# Patient Record
Sex: Female | Born: 1955 | Race: Black or African American | Hispanic: No | Marital: Single | Smoking: Never smoker
Health system: Southern US, Community
[De-identification: ages and names within clinical notes are randomized; demographics above are authoritative.]

## PROBLEM LIST (undated history)

## (undated) DIAGNOSIS — K219 Gastro-esophageal reflux disease without esophagitis: Secondary | ICD-10-CM

## (undated) DIAGNOSIS — Z789 Other specified health status: Secondary | ICD-10-CM

## (undated) DIAGNOSIS — I10 Essential (primary) hypertension: Secondary | ICD-10-CM

## (undated) HISTORY — DX: Gastro-esophageal reflux disease without esophagitis: K21.9

## (undated) HISTORY — PX: NO PAST SURGERIES: SHX2092

## (undated) HISTORY — DX: Essential (primary) hypertension: I10

---

## 1998-09-23 ENCOUNTER — Other Ambulatory Visit: Admission: RE | Admit: 1998-09-23 | Discharge: 1998-09-23 | Payer: Self-pay | Admitting: Nephrology

## 1999-02-14 ENCOUNTER — Emergency Department (HOSPITAL_COMMUNITY): Admission: EM | Admit: 1999-02-14 | Discharge: 1999-02-14 | Payer: Self-pay | Admitting: Emergency Medicine

## 1999-02-14 ENCOUNTER — Encounter: Payer: Self-pay | Admitting: Emergency Medicine

## 2001-05-09 ENCOUNTER — Inpatient Hospital Stay (HOSPITAL_COMMUNITY): Admission: AD | Admit: 2001-05-09 | Discharge: 2001-05-09 | Payer: Self-pay | Admitting: Obstetrics & Gynecology

## 2002-09-28 ENCOUNTER — Emergency Department (HOSPITAL_COMMUNITY): Admission: EM | Admit: 2002-09-28 | Discharge: 2002-09-28 | Payer: Self-pay | Admitting: Emergency Medicine

## 2005-01-15 ENCOUNTER — Emergency Department (HOSPITAL_COMMUNITY): Admission: EM | Admit: 2005-01-15 | Discharge: 2005-01-15 | Payer: Self-pay | Admitting: Emergency Medicine

## 2005-01-18 ENCOUNTER — Emergency Department (HOSPITAL_COMMUNITY): Admission: EM | Admit: 2005-01-18 | Discharge: 2005-01-19 | Payer: Self-pay | Admitting: Emergency Medicine

## 2006-01-19 IMAGING — CR DG CHEST 2V
2 series · 2 of 2 positions shown · non-contrast
Comparison: none

CLINICAL DATA: 49-year-old with headache and fever. 
 CHEST - TWO VIEW:
 Two views of the chest without prior studies for comparison demonstrate the cardiac silhouette, mediastinal and hilar contours to be within normal limits.  There is streaky bibasilar atelectasis and mild elevation of the right hemidiaphragm.  No edema or effusions.

[view not recorded (1 of 2)]
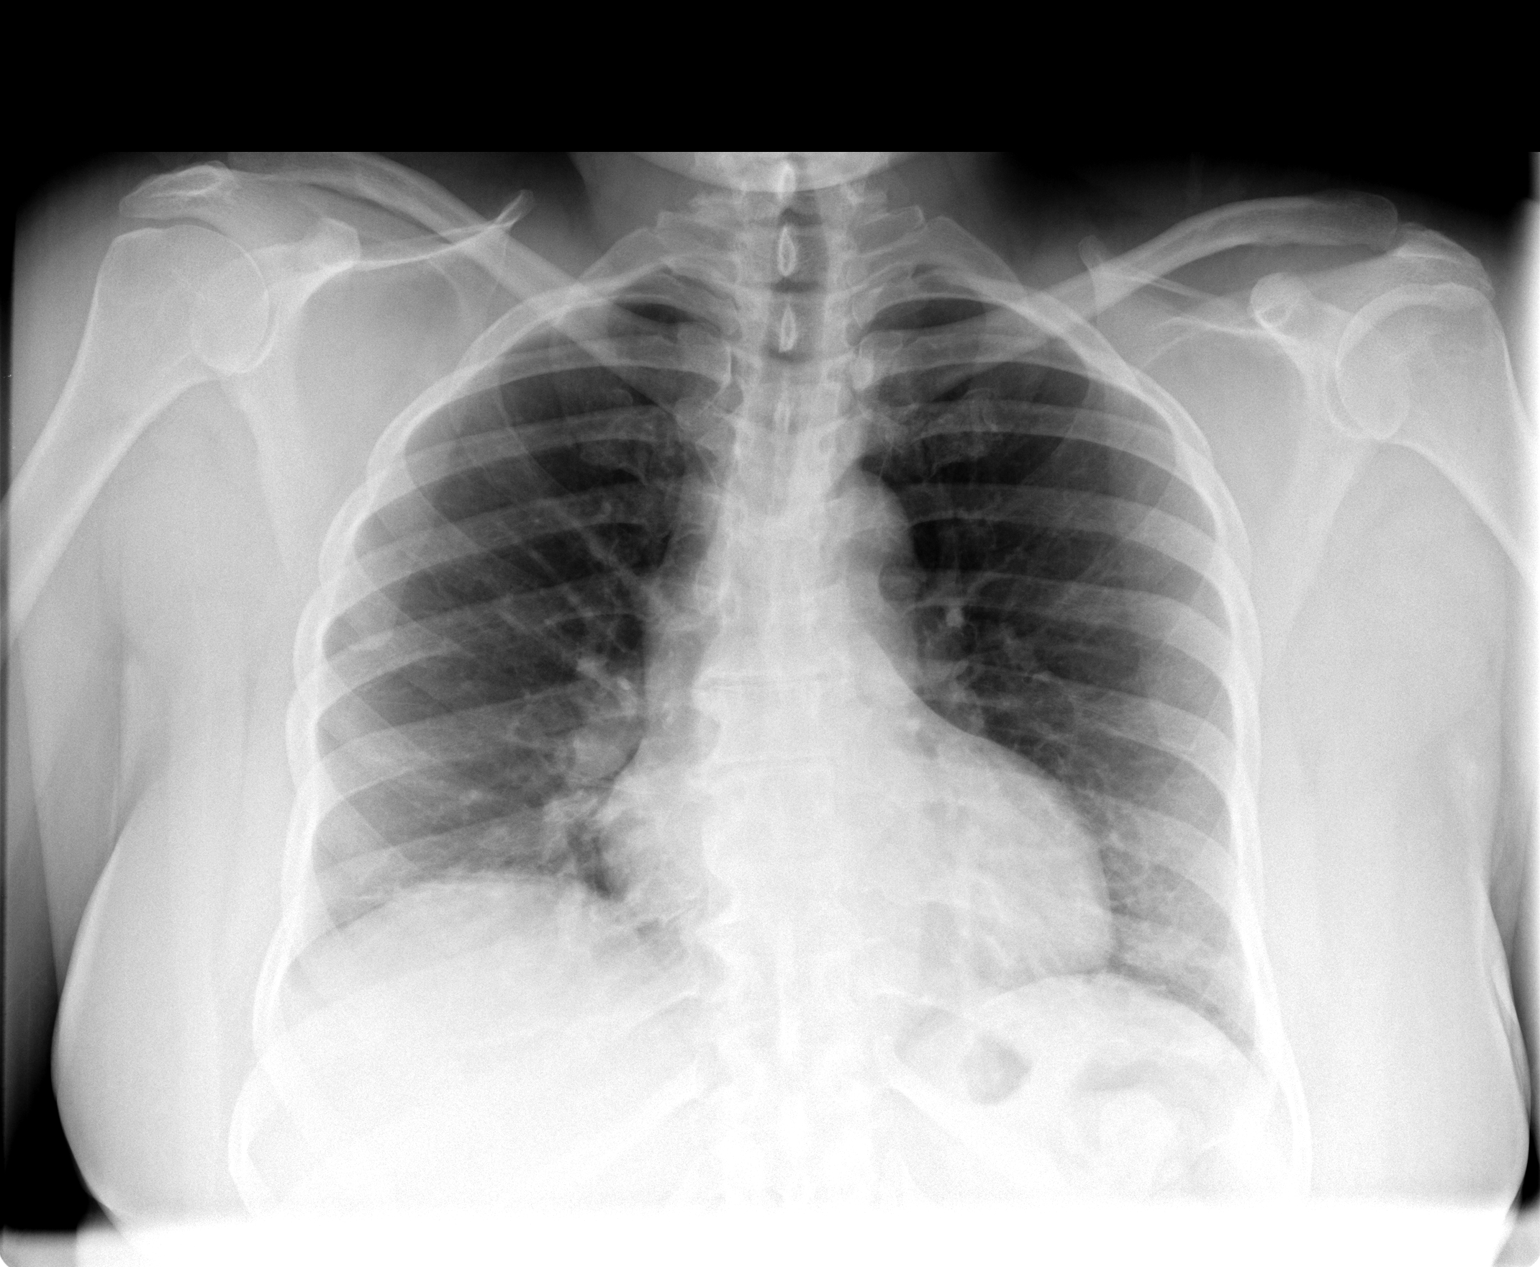

[view not recorded (2 of 2)]
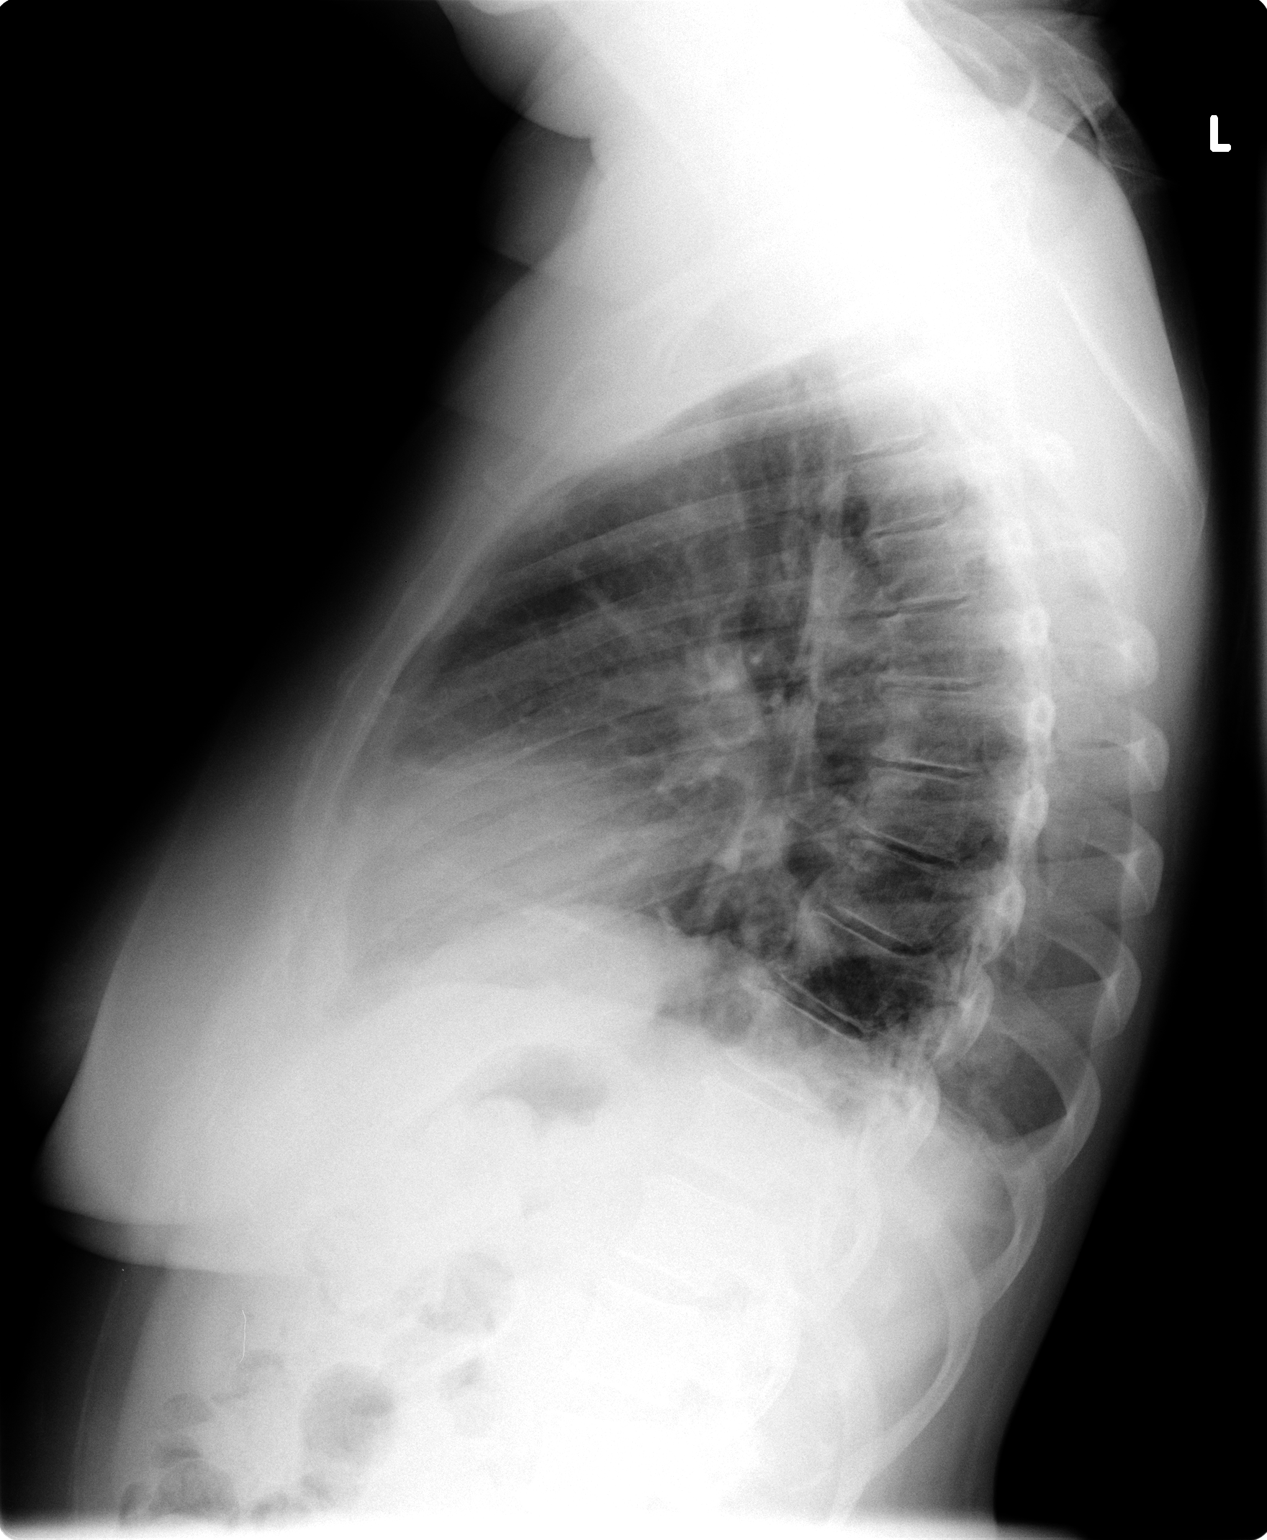

[2 of 2 positions shown; findings below may reference images not displayed]

IMPRESSION: Streaky bibasilar atelectasis.  No focal infiltrates, or edema.

## 2008-06-10 ENCOUNTER — Inpatient Hospital Stay (HOSPITAL_COMMUNITY): Admission: AD | Admit: 2008-06-10 | Discharge: 2008-06-10 | Payer: Self-pay | Admitting: Gynecology

## 2011-04-24 NOTE — Consult Note (Signed)
Robin Robin [name], Robin [name] NO.:  [id number]   MEDICAL RECORD NO.:  [id number]          PATIENT TYPE:  EMS   LOCATION:  ED                           FACILITY:  [facility]   PHYSICIAN:  Candyce Churn, M.D.DATE OF BIRTH:  04-14-1956   DATE OF CONSULTATION:  DATE OF DISCHARGE:                                   CONSULTATION   FINDINGS:  1.  Headache, resolving.  2.  Possible drug reaction to cephalexin causing her headache.  3.  History of allergy to SULFA and PENICILLIN--she has gotten a rash to      sulfa in the past and she had hives to a form of penicillin when she was      55 years old.  4.  Moderate obesity.   DISCHARGE MEDICATIONS:  Toradol 10 mg p.o. t.i.d. for 3-4 days p.r.n.  headache. This will be called in to [facility] on [address]10 doses  with no refills.   ER COURSE:  Robin Robin [name] is a very pleasant 55 year old female with no  significant past medical history except for allergies to sulfa and  penicillin.  She was seen three days prior to this evaluation in the  emergency room with a swollen gland under her left axilla.  She was given  a prescription for Keflex 500 mg 4 times a day.   She took several doses of the Keflex on Friday and Saturday and started to  develop a severe headache for which she presented to the [facility]  Emergency Room on January 19, 2005 at 025 in the a.m.   She had a severe headache on admission. She did not have photophobia. She  did have some nausea.  She was evaluated by the emergency room staff and  lumbar puncture and head CT were performed. Both of these examinations were  benign and after treatment with narcotics she was given 60 mg of Toradol IV  and her headache has essentially resolved. She is feeling back to her  baseline and would like to be discharged home.   PHYSICAL EXAMINATION:  GENERAL:  She is alert and oriented and comfortable.  VITAL SIGNS:  On admission revealed a temperature of 101.1, respiratory  rate  was 20, heart rate 108, blood pressure 149/79.  O2 saturation on room air  was 98%.  By 1478 a.m., her temperature was 98.5, heart rate 86, blood  pressure 120/82 and respiratory rate 18.  HEENT:  Reveals pupils are equal and reactive, extraocular movements intact.  Oropharynx is clear.  NECK:  Supple without JVD or thyromegaly or thyroid tenderness or masses.  Cranial nerves II-XII are intact.  CHEST:  Clear to auscultation.  CARDIAC:  Reveals a regular rhythm without murmurs, rubs or gallops.  ABDOMEN:  Soft, nontender, nondistended, no hepatomegaly or splenomegaly  noted. Bowel sounds are normal.  PELVIC/RECTAL:  Not performed.  BREASTS:  Not performed. I did examine under her left axilla and there was  no adenopathy, erythema or swelling in that area.  EXTREMITIES:  Without cyanosis, clubbing or edema. They are warm and  nonmodeled. Good capillary refill distally. The  skin is without rashes.   LABORATORY DATA:  Revealed a white count of 7400, hemoglobin 10.4, platelet  count 345,000. She has 82% neutrophils on differential. Cerebral spinal  fluid is colorless and clear. She had two red cells and one white call on  cell count.  Glucose CSF was 63, total protein was only 22.   CMET revealed a sodium of 135, potassium 3.9, chloride 103, bicarb 26,  glucose 127, BUN 8, creatinine 0.9, calcium 8.5. Total protein 7, albumin  2.7, SGOT 25, SGPT 25, alkaline phosphatase 82, total bilirubin 0.5,  urinalysis was clear and yellow. Specific gravity of 1.003, urine pH 6.5,  glucose negative, hemoglobin was large, bilirubin negative, ketones  negative, total protein 100. Nitrite negative, leukocyte esterase trace. 0-2  white cells, 0-2 red cells, bacteria few.   Chest x-ray revealed streaky bibasilar atelectasis, no focal infiltrates or  edema.  Noncontrast head CT revealed no acute intracranial abnormality.  Again, my evaluation at [facility] on January 19, 2005, she had markedly improved   symptomatically and was feeling quite well.  She had been intravenously  hydrated overnight and there does not appear to be any evidence of any  systemic bacterial infection and she certainly could have a mild viral  infection which is symptomatically and clinically much improved at this  time. I will call in Toradol as mentioned above, 10 mg p.o. t.i.d. to take  with food up to 72 hours or up to 10 doses and she will return for followup  should she not continue to improve. 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   She does have low grade proteinuria and she should make an appointment to  followup with a primary care physician in the next week or so.      RNG/MEDQ  D:  01/19/2005  T:  01/19/2005  Job:  [id number]

## 2011-09-03 LAB — POCT PREGNANCY, URINE
Operator id: 117411
Preg Test, Ur: NEGATIVE

## 2012-12-19 ENCOUNTER — Emergency Department (HOSPITAL_COMMUNITY)
Admission: EM | Admit: 2012-12-19 | Discharge: 2012-12-19 | Disposition: A | Discharge status: Home / Self Care | Payer: Self-pay | Attending: Emergency Medicine | Admitting: Emergency Medicine

## 2012-12-19 DIAGNOSIS — T4995XA Adverse effect of unspecified topical agent, initial encounter: Secondary | ICD-10-CM | POA: Insufficient documentation

## 2012-12-19 DIAGNOSIS — T783XXA Angioneurotic edema, initial encounter: Secondary | ICD-10-CM | POA: Insufficient documentation

## 2012-12-19 LAB — BASIC METABOLIC PANEL
BUN: 12 mg/dL (ref 6–23)
Chloride: 103 mEq/L (ref 96–112)
Creatinine, Ser: 0.76 mg/dL (ref 0.50–1.10)
GFR calc Af Amer: >90 mL/min (ref >90)
Glucose, Bld: 106 mg/dL — ABNORMAL HIGH (ref 70–99)
Potassium: 3.6 mEq/L (ref 3.5–5.1)

## 2012-12-19 LAB — CBC
HCT: 34.1 % — ABNORMAL LOW (ref 36.0–46.0)
Hemoglobin: 11.3 g/dL — ABNORMAL LOW (ref 12.0–15.0)
MCV: 84.8 fL (ref 78.0–100.0)
RDW: 13.4 % (ref 11.5–15.5)
WBC: 3.8 10*3/uL — ABNORMAL LOW (ref 4.0–10.5)

## 2012-12-19 MED ORDER — PREDNISONE 20 MG PO TABS: 60.0000 mg | ORAL_TABLET | Freq: Every day | ORAL | Status: DC

## 2012-12-19 MED ORDER — FAMOTIDINE 20 MG PO TABS: 20.0000 mg | ORAL_TABLET | Freq: Two times a day (BID) | ORAL | Status: DC

## 2012-12-19 MED ORDER — DIPHENHYDRAMINE HCL 25 MG PO TABS: 25.0000 mg | ORAL_TABLET | Freq: Four times a day (QID) | ORAL | Status: DC

## 2012-12-19 MED ORDER — METHYLPREDNISOLONE SODIUM SUCC 125 MG IJ SOLR
125.0000 mg | Freq: Once | INTRAMUSCULAR | Status: AC
  Administered 2012-12-19: 125 mg via INTRAVENOUS
  Filled 2012-12-19: qty 2

## 2012-12-19 MED ORDER — DIPHENHYDRAMINE HCL 50 MG/ML IJ SOLN
25.0000 mg | Freq: Once | INTRAMUSCULAR | Status: AC
  Administered 2012-12-19: 25 mg via INTRAVENOUS
  Filled 2012-12-19: qty 1

## 2012-12-19 MED ORDER — FAMOTIDINE IN NACL 20-0.9 MG/50ML-% IV SOLN
20.0000 mg | Freq: Once | INTRAVENOUS | Status: AC
  Administered 2012-12-19: 20 mg via INTRAVENOUS
  Filled 2012-12-19: qty 50

## 2012-12-19 NOTE — ED Notes (Signed)
Discharge inst given  Voiced understanding 

## 2012-12-19 NOTE — ED Notes (Signed)
Pt arrived via private vehicle from home. Pt states she woke and feels like something is digging in her throat. Pt states difficulty breathing, and unable to swallow saliva

## 2012-12-19 NOTE — ED Provider Notes (Signed)
History     CSN: [id number]  Arrival date & time 12/19/12  6578   First MD Initiated Contact with Patient 12/19/12 0324      Chief Complaint  Patient presents with  . Airway Obstruction    (Consider location/radiation/quality/duration/timing/severity/associated sxs/prior treatment) HPI History provided by patient. Went to bed in her normal state of health and woke up with difficulty swallowing and breathing due to something swelling in the back of her throat. She denies any sore throat. No itching. No rash. No history of same. No medications. No new foods or known new exposures. No history of same. No family history of angioedema.   No past medical history on file.  No past surgical history on file.  No family history on file.  History  Substance Use Topics  . Smoking status: Not on file  . Smokeless tobacco: Not on file  . Alcohol Use: Not on file    OB History    No data available      Review of Systems  Constitutional: Negative for fever and chills.  HENT: Positive for trouble swallowing. Negative for sore throat, neck pain, neck stiffness and voice change.   Eyes: Negative for pain.  Respiratory: Negative for cough and wheezing.   Cardiovascular: Negative for chest pain.  Gastrointestinal: Negative for abdominal pain.  Genitourinary: Negative for dysuria.  Musculoskeletal: Negative for back pain.  Skin: Negative for rash.  Neurological: Negative for headaches.  All other systems reviewed and are negative.    Allergies  Sulfa antibiotics  Home Medications  No current outpatient prescriptions on file.  BP 160/94  Pulse 109  Temp 99 F (37.2 C) (Oral)  Physical Exam  Constitutional: She is oriented to person, place, and time. She appears well-developed and well-nourished.  HENT:  Head: Normocephalic and atraumatic.       Significant uvular edema without associated lingual edema or tonsillar swelling. No oral exudates. Moist mucous membranes. Uvula is  midline. No trismus. No submental fullness or tenderness. No cervical lymphadenopathy appreciated  Eyes: EOM are normal. Pupils are equal, round, and reactive to light.  Neck: Neck supple. No tracheal deviation present.  Cardiovascular: Normal rate, regular rhythm and intact distal pulses.   Pulmonary/Chest: Effort normal and breath sounds normal. No stridor. No respiratory distress. She has no wheezes.  Abdominal: Soft. Bowel sounds are normal. She exhibits no distension. There is no tenderness.  Musculoskeletal: Normal range of motion. She exhibits no edema.  Neurological: She is alert and oriented to person, place, and time.  Skin: Skin is warm and dry.    ED Course  Procedures (including critical care time)  IV Solu-Medrol, Benadryl and Pepcid.  6:11 AM vision significantly improved is resting comfortably in emergency department, swelling decreased and exam much improved. Patient is requesting to be discharged home. Plan prescription for prednisone, Benadryl and Pepcid with strict return precautions verbalizes understood.   MDM   Uvular swelling concern for angioedema. No symptoms of allergic reaction otherwise.  Improved with medications as above. Serial evaluations. Prescriptions provided. Vital signs and nursing notes reviewed.        Sunnie Nielsen, MD 12/19/12 [phone]

## 2012-12-19 NOTE — ED Notes (Signed)
Enlarged uvula noted.

## 2012-12-19 NOTE — ED Notes (Signed)
Patient states she is feeling better.

## 2012-12-19 NOTE — ED Notes (Signed)
Patient amb to the BR without difficulty

## 2015-01-08 ENCOUNTER — Inpatient Hospital Stay (HOSPITAL_COMMUNITY)
Admission: AD | Admit: 2015-01-08 | Discharge: 2015-01-08 | Disposition: A | Discharge status: Home / Self Care | Payer: Self-pay | Source: Ambulatory Visit | Attending: Family Medicine | Admitting: Family Medicine

## 2015-01-08 ENCOUNTER — Encounter (HOSPITAL_COMMUNITY): Payer: Self-pay | Admitting: *Deleted

## 2015-01-08 DIAGNOSIS — N950 Postmenopausal bleeding: Secondary | ICD-10-CM

## 2015-01-08 DIAGNOSIS — N760 Acute vaginitis: Secondary | ICD-10-CM

## 2015-01-08 DIAGNOSIS — B9689 Other specified bacterial agents as the cause of diseases classified elsewhere: Secondary | ICD-10-CM | POA: Insufficient documentation

## 2015-01-08 DIAGNOSIS — R030 Elevated blood-pressure reading, without diagnosis of hypertension: Secondary | ICD-10-CM | POA: Insufficient documentation

## 2015-01-08 DIAGNOSIS — IMO0001 Reserved for inherently not codable concepts without codable children: Secondary | ICD-10-CM

## 2015-01-08 HISTORY — DX: Other specified health status: Z78.9

## 2015-01-08 LAB — URINE MICROSCOPIC-ADD ON

## 2015-01-08 LAB — URINALYSIS, ROUTINE W REFLEX MICROSCOPIC
BILIRUBIN URINE: NEGATIVE
GLUCOSE, UA: NEGATIVE mg/dL
Ketones, ur: NEGATIVE mg/dL
Nitrite: NEGATIVE
PH: 6 (ref 5.0–8.0)
PROTEIN: NEGATIVE mg/dL
SPECIFIC GRAVITY, URINE: 1.015 (ref 1.005–1.030)
Urobilinogen, UA: 0.2 mg/dL (ref 0.0–1.0)

## 2015-01-08 LAB — WET PREP, GENITAL
Trich, Wet Prep: NONE SEEN
Yeast Wet Prep HPF POC: NONE SEEN

## 2015-01-08 LAB — POCT PREGNANCY, URINE: Preg Test, Ur: NEGATIVE

## 2015-01-08 MED ORDER — METRONIDAZOLE 500 MG PO TABS: 500.0000 mg | ORAL_TABLET | Freq: Two times a day (BID) | ORAL | Status: DC

## 2015-01-08 MED ORDER — HYDROCHLOROTHIAZIDE 25 MG PO TABS: ORAL_TABLET | ORAL | Status: AC

## 2015-01-08 MED ORDER — METRONIDAZOLE 500 MG PO TABS: ORAL_TABLET | ORAL | Status: DC

## 2015-01-08 NOTE — MAU Note (Signed)
Month or two ago, took 5 preg tests: 3 were positive, 2 were negative.   Has been really nauseated.  Had tubal 30 yrs ago.  Light episode of bleeding about 4 months ago.... No periods in 4 yrs.

## 2015-01-08 NOTE — MAU Provider Note (Signed)
CC: No chief complaint on file.    First Provider Initiated Contact with Patient 01/08/15 1724      HPI Robin [name] is a 59 y.o. [id number] who thinks she may be pregnant due to feeling nauseated and having + HPT x3 which she brought in with her. She is 5 years postmenopausal. Had 1 episode of spotting about 4 months ago. Last Pap about 4 years ago. Sexually active. No dysparunia or irritative vaginal discharge. Does not have Ob/Gyn or PCP. Denies dx HTN.    No past medical history on file.  OB History  No data available    No past surgical history on file.  History   Social History  . Marital Status: Single    Spouse Name: N/A    Number of Children: N/A  . Years of Education: N/A   Occupational History  . Not on file.   Social History Main Topics  . Smoking status: Not on file  . Smokeless tobacco: Not on file  . Alcohol Use: Not on file  . Drug Use: Not on file  . Sexual Activity: Not on file   Other Topics Concern  . Not on file   Social History Narrative  . No narrative on file    No current facility-administered medications on file prior to encounter.   Current Outpatient Prescriptions on File Prior to Encounter  Medication Sig Dispense Refill  . diphenhydrAMINE (BENADRYL) 25 MG tablet Take 1 tablet (25 mg total) by mouth every 6 (six) hours. 20 tablet 0  . famotidine (PEPCID) 20 MG tablet Take 1 tablet (20 mg total) by mouth 2 (two) times daily. 30 tablet 0  . predniSONE (DELTASONE) 20 MG tablet Take 3 tablets (60 mg total) by mouth daily. (Patient not taking: Reported on 01/08/2015) 15 tablet 0    Allergies  Allergen Reactions  . Sulfa Antibiotics Other (See Comments)    Head and throat hurt     ROS Pertinent items in HPI   PHYSICAL EXAM Filed Vitals:   01/08/15 1649  BP: 169/81  Pulse: 70  Temp: 98.4 F (36.9 C)  Resp: 18   General: Well nourished, well developed female in no acute distress Cardiovascular: Normal rate Respiratory:  Normal effort Abdomen: Soft, nontender Back: No CVAT Extremities: No edema Neurologic:grossly intact Speculum exam: NEFG; vagina with white homogenous discharge, no blood; cervix clean Bimanual exam: cervix closed, no CMT; uterus NSSP; no adnexal tenderness or masses   LAB RESULTS Results for orders placed or performed during the hospital encounter of 01/08/15 (from the past 24 hour(s))  Pregnancy, urine POC     Status: None   Collection Time: 01/08/15  5:09 PM  Result Value Ref Range   Preg Test, Ur NEGATIVE NEGATIVE    IMAGING No results found.  MAU COURSE D/W Dr. Shawnie PonsPratt   ASSESSMENT  1. BV (bacterial vaginosis)   2. Elevated BP   3. Postmenopausal vaginal bleeding     PLAN Discharge home. See AVS for patient education.    Medication List    STOP taking these medications        aspirin 325 MG tablet     aspirin 81 MG chewable tablet     bismuth subsalicylate 262 MG/15ML suspension  Commonly known as:  PEPTO BISMOL     diphenhydrAMINE 25 MG tablet  Commonly known as:  BENADRYL     famotidine 20 MG tablet  Commonly known as:  PEPCID     predniSONE 20 MG tablet  Commonly known as:  DELTASONE      TAKE these medications        calcium-vitamin D 250-100 MG-UNIT per tablet  Take 1-2 tablets by mouth daily.     FISH OIL PO  Take 1 capsule by mouth daily.     hydrochlorothiazide 25 MG tablet  Commonly known as:  HYDRODIURIL  Take 1/2 tab (12.5mg ) qd     metroNIDAZOLE 500 MG tablet  Commonly known as:  FLAGYL  Take 1 tablet (500 mg total) by mouth 2 (two) times daily. Take all 4 tabs at once     multivitamin with minerals Tabs tablet  Take 1 tablet by mouth daily.       Follow-up Information    Follow up with [facility].   Specialty:  Obstetrics and Gynecology   Why:  Someone from Clinic will call you with appt.   Contact information:   [address] [place] [address] [phone]       Danae Orleans,  CNM 01/08/2015 5:25 PM

## 2015-01-08 NOTE — Discharge Instructions (Signed)
Hypertension °Hypertension, commonly called high blood pressure, is when the force of blood pumping through your arteries is too strong. Your arteries are the blood vessels that carry blood from your heart throughout your body. A blood pressure reading consists of a higher number over a lower number, such as 110/72. The higher number (systolic) is the pressure inside your arteries when your heart pumps. The lower number (diastolic) is the pressure inside your arteries when your heart relaxes. Ideally you want your blood pressure below 120/80. °Hypertension forces your heart to work harder to pump blood. Your arteries may become narrow or stiff. Having hypertension puts you at risk for heart disease, stroke, and other problems.  °RISK FACTORS °Some risk factors for high blood pressure are controllable. Others are not.  °Risk factors you cannot control include:  °· Race. You may be at higher risk if you are African American. °· Age. Risk increases with age. °· Gender. Men are at higher risk than women before age 45 years. After age 65, women are at higher risk than men. °Risk factors you can control include: °· Not getting enough exercise or physical activity. °· Being overweight. °· Getting too much fat, sugar, calories, or salt in your diet. °· Drinking too much alcohol. °SIGNS AND SYMPTOMS °Hypertension does not usually cause signs or symptoms. Extremely high blood pressure (hypertensive crisis) may cause headache, anxiety, shortness of breath, and nosebleed. °DIAGNOSIS  °To check if you have hypertension, your health care provider will measure your blood pressure while you are seated, with your arm held at the level of your heart. It should be measured at least twice using the same arm. Certain conditions can cause a difference in blood pressure between your right and left arms. A blood pressure reading that is higher than normal on one occasion does not mean that you need treatment. If one blood pressure reading  is high, ask your health care provider about having it checked again. °TREATMENT  °Treating high blood pressure includes making lifestyle changes and possibly taking medicine. Living a healthy lifestyle can help lower high blood pressure. You may need to change some of your habits. °Lifestyle changes may include: °· Following the DASH diet. This diet is high in fruits, vegetables, and whole grains. It is low in salt, red meat, and added sugars. °· Getting at least 2½ hours of brisk physical activity every week. °· Losing weight if necessary. °· Not smoking. °· Limiting alcoholic beverages. °· Learning ways to reduce stress. ° If lifestyle changes are not enough to get your blood pressure under control, your health care provider may prescribe medicine. You may need to take more than one. Work closely with your health care provider to understand the risks and benefits. °HOME CARE INSTRUCTIONS °· Have your blood pressure rechecked as directed by your health care provider.   °· Take medicines only as directed by your health care provider. Follow the directions carefully. Blood pressure medicines must be taken as prescribed. The medicine does not work as well when you skip doses. Skipping doses also puts you at risk for problems.   °· Do not smoke.   °· Monitor your blood pressure at home as directed by your health care provider.  °SEEK MEDICAL CARE IF:  °· You think you are having a reaction to medicines taken. °· You have recurrent headaches or feel dizzy. °· You have swelling in your ankles. °· You have trouble with your vision. °SEEK IMMEDIATE MEDICAL CARE IF: °· You develop a severe headache or confusion. °·   You have unusual weakness, numbness, or feel faint.  You have severe chest or abdominal pain.  You vomit repeatedly.  You have trouble breathing. MAKE SURE YOU:   Understand these instructions.  Will watch your condition.  Will get help right away if you are not doing well or get worse. Document  Released: 11/23/2005 Document Revised: 04/09/2014 Document Reviewed: 09/15/2013 [facility] Patient Information 2015 [place], [place]. This information is not intended to replace advice given to you by your health care provider. Make sure you discuss any questions you have with your health care provider. Bacterial Vaginosis Bacterial vaginosis is a vaginal infection that occurs when the normal balance of bacteria in the vagina is disrupted. It results from an overgrowth of certain bacteria. This is the most common vaginal infection in women of childbearing age. Treatment is important to prevent complications, especially in pregnant women, as it can cause a premature delivery. CAUSES  Bacterial vaginosis is caused by an increase in harmful bacteria that are normally present in smaller amounts in the vagina. Several different kinds of bacteria can cause bacterial vaginosis. However, the reason that the condition develops is not fully understood. RISK FACTORS Certain activities or behaviors can put you at an increased risk of developing bacterial vaginosis, including:  Having a new sex partner or multiple sex partners.  Douching.  Using an intrauterine device (IUD) for contraception. Women do not get bacterial vaginosis from toilet seats, bedding, swimming pools, or contact with objects around them. SIGNS AND SYMPTOMS  Some women with bacterial vaginosis have no signs or symptoms. Common symptoms include:  Grey vaginal discharge.  A fishlike odor with discharge, especially after sexual intercourse.  Itching or burning of the vagina and vulva.  Burning or pain with urination. DIAGNOSIS  Your health care provider will take a medical history and examine the vagina for signs of bacterial vaginosis. A sample of vaginal fluid may be taken. Your health care provider will look at this sample under a microscope to check for bacteria and abnormal cells. A vaginal pH test may also be done.  TREATMENT    Bacterial vaginosis may be treated with antibiotic medicines. These may be given in the form of a pill or a vaginal cream. A second round of antibiotics may be prescribed if the condition comes back after treatment.  HOME CARE INSTRUCTIONS   Only take over-the-counter or prescription medicines as directed by your health care provider.  If antibiotic medicine was prescribed, take it as directed. Make sure you finish it even if you start to feel better.  Do not have sex until treatment is completed.  Tell all sexual partners that you have a vaginal infection. They should see their health care provider and be treated if they have problems, such as a mild rash or itching.  Practice safe sex by using condoms and only having one sex partner. SEEK MEDICAL CARE IF:   Your symptoms are not improving after 3 days of treatment.  You have increased discharge or pain.  You have a fever.   MAKE SURE YOU:   Understand these instructions.  Will watch your condition.  Will get help right away if you are not doing well or get worse. FOR MORE INFORMATION  Centers for Disease Control and Prevention, Division of STD Prevention: SolutionApps.co.zawww.cdc.gov/std American Sexual Health Association (ASHA): www.ashastd.org  Document Released: 11/23/2005 Document Revised: 09/13/2013 Document Reviewed: 07/05/2013 [facility]Exit[facility] Patient Information 2015 [place], [place]LL[place]. This information is not intended to replace advice given to you by your  health care provider. Make sure you discuss any questions you have with your health care provider.  The next Free Pap cClilnic is at [facility], [address] [place] on Feb 22 6-7:30 Call Dolores Lory [phone] for information on the [facility] to get a doctor

## 2015-01-08 NOTE — MAU Note (Signed)
C/o vaginal bleeding after no periods for 5 years; had + home pregnancy test at home; upt in MAu is negative;

## 2015-01-09 ENCOUNTER — Telehealth: Payer: Self-pay | Admitting: General Practice

## 2015-01-09 ENCOUNTER — Encounter: Payer: Self-pay | Admitting: Nurse Practitioner

## 2015-01-09 DIAGNOSIS — Z7689 Persons encountering health services in other specified circumstances: Secondary | ICD-10-CM

## 2015-01-09 LAB — GC/CHLAMYDIA PROBE AMP (~~LOC~~) NOT AT ARMC
CHLAMYDIA, DNA PROBE: NEGATIVE
NEISSERIA GONORRHEA: NEGATIVE

## 2015-01-09 LAB — HIV ANTIBODY (ROUTINE TESTING W REFLEX): HIV Screen 4th Generation wRfx: NONREACTIVE

## 2015-01-09 NOTE — Telephone Encounter (Signed)
Put referral in epic for PCP to [facility]. Called patient and informed her of appt in our office and referral. Discussed with patient she should call [facility] in the morning to set up an appt for next week. Patient verbalized understanding and had no other questions

## 2015-01-09 NOTE — Telephone Encounter (Signed)
-----   Message from Danae Orleanseirdre C Poe, CNM sent at 01/08/2015  6:11 PM EST ----- Postmenopausal spotting 4 months ago. Next available appointment. (I referred her to the next free [facility] and she needs a referral for primary care for HTN management)

## 2015-01-14 ENCOUNTER — Emergency Department (HOSPITAL_COMMUNITY)
Admission: EM | Admit: 2015-01-14 | Discharge: 2015-01-15 | Disposition: A | Discharge status: Home / Self Care | Payer: Self-pay | Attending: Emergency Medicine | Admitting: Emergency Medicine

## 2015-01-14 ENCOUNTER — Encounter (HOSPITAL_COMMUNITY): Payer: Self-pay

## 2015-01-14 DIAGNOSIS — Z79899 Other long term (current) drug therapy: Secondary | ICD-10-CM | POA: Insufficient documentation

## 2015-01-14 DIAGNOSIS — Z7982 Long term (current) use of aspirin: Secondary | ICD-10-CM | POA: Insufficient documentation

## 2015-01-14 DIAGNOSIS — Z792 Long term (current) use of antibiotics: Secondary | ICD-10-CM | POA: Insufficient documentation

## 2015-01-14 DIAGNOSIS — R1013 Epigastric pain: Secondary | ICD-10-CM | POA: Insufficient documentation

## 2015-01-14 LAB — CBC WITH DIFFERENTIAL/PLATELET
BASOS ABS: 0 10*3/uL (ref 0.0–0.1)
BASOS PCT: 0 % (ref 0–1)
Eosinophils Absolute: 0.1 10*3/uL (ref 0.0–0.7)
Eosinophils Relative: 2 % (ref 0–5)
HEMATOCRIT: 36.5 % (ref 36.0–46.0)
HEMOGLOBIN: 11.9 g/dL — AB (ref 12.0–15.0)
LYMPHS ABS: 1.7 10*3/uL (ref 0.7–4.0)
Lymphocytes Relative: 32 % (ref 12–46)
MCH: 27.9 pg (ref 26.0–34.0)
MCHC: 32.6 g/dL (ref 30.0–36.0)
MCV: 85.5 fL (ref 78.0–100.0)
MONO ABS: 0.5 10*3/uL (ref 0.1–1.0)
Monocytes Relative: 10 % (ref 3–12)
Neutro Abs: 3 10*3/uL (ref 1.7–7.7)
Neutrophils Relative %: 56 % (ref 43–77)
Platelets: 238 10*3/uL (ref 150–400)
RBC: 4.27 MIL/uL (ref 3.87–5.11)
RDW: 13.8 % (ref 11.5–15.5)
WBC: 5.4 10*3/uL (ref 4.0–10.5)

## 2015-01-14 LAB — BASIC METABOLIC PANEL
Anion gap: 7 (ref 5–15)
BUN: 20 mg/dL (ref 6–23)
CHLORIDE: 101 mmol/L (ref 96–112)
CO2: 29 mmol/L (ref 19–32)
Calcium: 9.3 mg/dL (ref 8.4–10.5)
Creatinine, Ser: 0.89 mg/dL (ref 0.50–1.10)
GFR calc Af Amer: 81 mL/min — ABNORMAL LOW (ref >90)
GFR, EST NON AFRICAN AMERICAN: 70 mL/min — AB (ref >90)
GLUCOSE: 94 mg/dL (ref 70–99)
POTASSIUM: 3.5 mmol/L (ref 3.5–5.1)
Sodium: 137 mmol/L (ref 135–145)

## 2015-01-14 LAB — LIPASE, BLOOD: LIPASE: 33 U/L (ref 11–59)

## 2015-01-14 LAB — AMYLASE: Amylase: 93 U/L (ref 0–105)

## 2015-01-14 MED ORDER — GI COCKTAIL ~~LOC~~
30.0000 mL | Freq: Once | ORAL | Status: AC
  Administered 2015-01-14: 30 mL via ORAL
  Filled 2015-01-14: qty 30

## 2015-01-14 NOTE — ED Notes (Signed)
Pt complains of epigastric pain when she belches, she states that pepto helps her and burping relieves the pain for a few minutes

## 2015-01-15 LAB — URINE MICROSCOPIC-ADD ON

## 2015-01-15 LAB — URINALYSIS, ROUTINE W REFLEX MICROSCOPIC
BILIRUBIN URINE: NEGATIVE
Glucose, UA: NEGATIVE mg/dL
Ketones, ur: NEGATIVE mg/dL
NITRITE: NEGATIVE
PH: 6 (ref 5.0–8.0)
Protein, ur: NEGATIVE mg/dL
SPECIFIC GRAVITY, URINE: 1.023 (ref 1.005–1.030)
Urobilinogen, UA: 0.2 mg/dL (ref 0.0–1.0)

## 2015-01-15 MED ORDER — FAMOTIDINE 20 MG PO TABS
20.0000 mg | ORAL_TABLET | Freq: Once | ORAL | Status: AC
  Administered 2015-01-15: 20 mg via ORAL
  Filled 2015-01-15: qty 1

## 2015-01-15 MED ORDER — FAMOTIDINE 20 MG PO TABS: 20.0000 mg | ORAL_TABLET | Freq: Two times a day (BID) | ORAL | Status: AC

## 2015-01-15 MED ORDER — GI COCKTAIL ~~LOC~~
30.0000 mL | Freq: Once | ORAL | Status: AC
  Administered 2015-01-15: 30 mL via ORAL
  Filled 2015-01-15: qty 30

## 2015-01-15 NOTE — Discharge Instructions (Signed)
Abdominal Pain Many things can cause belly (abdominal) pain. Most times, the belly pain is not dangerous. Many cases of belly pain can be watched and treated at home. HOME CARE   Do not take medicines that help you go poop (laxatives) unless told to by your doctor.  Only take medicine as told by your doctor.  Eat or drink as told by your doctor. Your doctor will tell you if you should be on a special diet. GET HELP IF:  You do not know what is causing your belly pain.  You have belly pain while you are sick to your stomach (nauseous) or have runny poop (diarrhea).  You have pain while you pee or poop.  Your belly pain wakes you up at night.  You have belly pain that gets worse or better when you eat.  You have belly pain that gets worse when you eat fatty foods.  You have a fever. GET HELP RIGHT AWAY IF:   The pain does not go away within 2 hours.  You keep throwing up (vomiting).  The pain changes and is only in the right or left part of the belly.  You have bloody or tarry looking poop. MAKE SURE YOU:   Understand these instructions.  Will watch your condition.  Will get help right away if you are not doing well or get worse. Document Released: 05/11/2008 Document Revised: 11/28/2013 Document Reviewed: 08/02/2013 [facility] Patient Information 2015 [place], [place]. This information is not intended to replace advice given to you by your health care provider. Make sure you discuss any questions you have with your health care provider.  Abdominal Pain Many things can cause abdominal pain. Usually, abdominal pain is not caused by a disease and will improve without treatment. It can often be observed and treated at home. Your health care provider will do a physical exam and possibly order blood tests and X-rays to help determine the seriousness of your pain. However, in many cases, more time must pass before a clear cause of the pain can be found. Before that point, your health  care provider may not know if you need more testing or further treatment. HOME CARE INSTRUCTIONS  Monitor your abdominal pain for any changes. The following actions may help to alleviate any discomfort you are experiencing:  Only take over-the-counter or prescription medicines as directed by your health care provider.  Do not take laxatives unless directed to do so by your health care provider.  Try a clear liquid diet (broth, tea, or water) as directed by your health care provider. Slowly move to a bland diet as tolerated. SEEK MEDICAL CARE IF:  You have unexplained abdominal pain.  You have abdominal pain associated with nausea or diarrhea.  You have pain when you urinate or have a bowel movement.  You experience abdominal pain that wakes you in the night.  You have abdominal pain that is worsened or improved by eating food.  You have abdominal pain that is worsened with eating fatty foods.  You have a fever. SEEK IMMEDIATE MEDICAL CARE IF:   Your pain does not go away within 2 hours.  You keep throwing up (vomiting).  Your pain is felt only in portions of the abdomen, such as the right side or the left lower portion of the abdomen.  You pass bloody or black tarry stools. MAKE SURE YOU:  Understand these instructions.   Will watch your condition.   Will get help right away if you are not  doing well or get worse.  Document Released: 09/02/2005 Document Revised: 11/28/2013 Document Reviewed: 08/02/2013 [facility] Patient Information 2015 [place], [place]. This information is not intended to replace advice given to you by your health care provider. Make sure you discuss any questions you have with your health care provider.   Emergency Department Resource Guide 1) Find a Doctor and Pay Out of Pocket Although you won't have to find out who is covered by your insurance plan, it is a good idea to ask around and get recommendations. You will then need to call the office and  see if the doctor you have chosen will accept you as a new patient and what types of options they offer for patients who are self-pay. Some doctors offer discounts or will set up payment plans for their patients who do not have insurance, but you will need to ask so you aren't surprised when you get to your appointment.  2) Contact Your Local Health Department Not all health departments have doctors that can see patients for sick visits, but many do, so it is worth a call to see if yours does. If you don't know where your local health department is, you can check in your phone book. The CDC also has a tool to help you locate your state's health department, and many state websites also have listings of all of their local health departments.  3) Find a Walk-in Clinic If your illness is not likely to be very severe or complicated, you may want to try a walk in clinic. These are popping up all over the country in pharmacies, drugstores, and shopping centers. They're usually staffed by nurse practitioners or physician assistants that have been trained to treat common illnesses and complaints. They're usually fairly quick and inexpensive. However, if you have serious medical issues or chronic medical problems, these are probably not your best option.  No Primary Care Doctor: - Call Health Connect at  [phone] - they can help you locate a primary care doctor that  accepts your insurance, provides certain services, etc. - Physician Referral Service- [phone]  Chronic Pain Problems: Organization         Address  Phone   Notes  [facility]  [phone] Patients need to be referred by their primary care doctor.   Medication Assistance: Organization         Address  Phone   Notes  [facility] Medication [facility] [address] [place], [place] [address] [phone] --Must be a resident of [facility] -- Must have NO insurance coverage whatsoever  (no Medicaid/ Medicare, etc.) -- The pt. MUST have a primary care doctor that directs their care regularly and follows them in the community   [facility]ist  [phone]   [employer]  [phone]    Agencies that provide inexpensive medical care: Organization         Address  Phone   Notes  [facility]  [phone]   [facility]    [phone]   [facility] [address] [place], [place] [address] [phone]   [facility] [address], [place] [phone]   Planned Parenthood    [phone]   [facility]    [phone]   [facility]  [address], [place] Phone:  [phone], Fax:  [phone] Hours of Operation:  9 am - 6 pm, M-F.  Also accepts Medicaid/Medicare and self-pay.  [facility]  [address], [place] Phone: [phone], Fax: [phone]. Hours of Operation:  8:30 am - 5:30 pm, M-F.  Also accepts Medicaid and self-pay.  [facility] [place] [address], [place] Phone: [phone]   [facility] [address] Natasha Bence [place], [place] [phone], Ext. [phone] Mondays & Thursdays: 7-9 AM.  First 15 patients are seen on a first come, first serve basis.    Medicaid-accepting [facility] Providers:  Organization         Address  Phone   Notes  [facility] [address], [place] [phone] Also accepts self-pay patients.  [facility] [address] Laurell Josephs [place], [place]  [phone]   [facility] [address], [place] [phone]   [facility] [address], [place] [phone]   Renaye Rakers [address], [place]   [phone] Only accepts [place] Access [place] patients after they have their name applied to their  card.   Self-Pay (no insurance) in [facility]:  Organization         Address  Phone   Notes  Sickle Cell Patients, [facility] Internal Medicine [address] [place], [place] [phone]   [facility]gent Care [address] [place], [place] [phone]   [facility] [place]  [address], [place] [phone]   [facility]/Dr. Osei-Bonsu  [address] [place] or [address], [place] [phone] Phone number for both [place] and [place] locations is the same.  [facility] [address], [place] [phone]   [facility] [address] [place] or [address] [phone]   [facility] [address] [place] [phone], phone; [phone], fax Sees patients 1st and 3rd Saturday of every month.  Must not qualify for public or private insurance (i.e. Medicaid, Medicare, [place] Health Choice, Veterans' Benefits)  Household income should be no more than 200% of the poverty level The clinic cannot treat you if you are pregnant or think you are pregnant  Sexually transmitted diseases are not treated at the clinic.    Dental Care: Organization         Address  Phone  Notes  [facility] of [employer] [facility] [address] [place], [place] [phone] Accepts children up to age 50 who are enrolled in [place] or [place] Health Choice; pregnant women with a Medicaid card; and children who have applied for Medicaid or [place] Health Choice, but were declined, whose parents can pay a reduced fee at time of service.  [facility] Department of [facility]  [address], [place] [phone] Accepts children up to age 30 who are enrolled in [place] or [place] Health Choice; pregnant women with a Medicaid card; and children who have applied for Medicaid or [place] Health  Choice, but were declined, whose parents can pay a reduced fee at time of service.  [facility]  [address] [place], [place] [phone] Patients are seen by appointment only. Walk-ins are not accepted. [facility] will see patients 18 years  of age and older. Monday - Tuesday (8am-5pm) Most Wednesdays (8:30-5pm) $30 per visit, cash only  [facility]  [address], [facility] [phone] Patients are seen by appointment only. Walk-ins are not accepted. [facility] will see patients 58 years of age and older. One Wednesday Evening (Monthly: Volunteer Based).  $30 per visit, cash only  [employer] [facility]  [phone] for adults; Children under age 514, call Graduate Pediatric Dentistry at [phone]. Children aged 304-14, please call [phone] to request a pediatric application.  Dental services are provided in all areas of dental care including fillings, crowns and bridges, complete and partial dentures, implants, gum treatment, root canals, and extractions. Preventive care is also provided. Treatment is provided to both adults and children. Patients are selected via a lottery and there is often a waiting list.   [facility] [address], [place]  [phone] [email or web address]   [facility] [address], [place], [place] [phone], Ext. [phone] Second and Fourth Thursday of each month, opens at 6:30 AM; Clinic ends at 9 AM.  Patients are seen on a first-come first-served basis, and a limited number are seen during each clinic.   [facility]  [address], [place], [place] [phone]   Eligibility Requirements You must have lived in [place], [place], or [place] counties for at least the last three months.   You cannot be eligible for state or federal sponsored [place]healthcare insurance, including CIG[employer]Veterans Administration, [place]icaid, or [employer]icare.   You  generally cannot be eligible for healthcare insurance through your employer.    How to apply: Eligibility screenings are held every Tuesday and Wednesday afternoon from 1:00 pm until 4:00 pm. You do not need an appointment for the interview!  [facility] [address], [place], [place] [phone]   [facility]  [phone]   [facility]  [phone]   [facility]  [phone]    Behavioral Health Resources in the Community: Intensive Outpatient Programs Organization         Address  Phone  Notes  [facility] [address], [place], [place] [phone]   [facility] [address], [place], [place] [phone]   [facility] [address], [place], [place]  [phone]   [facility] [address],  [place], [place] [phone] or [phone]   Substance Abuse Resources Organization         Address  Phone  Notes  [facility]  [phone]   [facility]  [phone]   [facility]  [phone]   [facility]  [phone]   Residential & Outpatient Substance Abuse Program  [phone]   Psychological Services Organization         Address  Phone  Notes  [facility]  [phone]- [phone]   [facility]  [phone]- [phone]   [facility] [address], [place] [phone] or [phone]    Mobile Crisis Teams Organization         Address  Phone  Notes  [facility], Mobile Crisis Care Unit  [phone]   [facility]  [address] [place], [place] [phone]   [facility] [address] [place] [phone]    Self-Help/Support Groups Organization  Address  Phone             Notes  [facility] [place] - variety of support groups  [phone]- [phone] Call for  more information  Narcotics Anonymous (NA), [facility] [address], [employer] [place]  2 meetings at this location   Statistician         Address  Phone  Notes  [facility] [address],    [place]  [phone]   [facility]  [address] [place] [address], [place], [place] [phone]   [facility] [address] [place], [place] [phone] Admissions: 8am-3pm M-F  [facility] [address],    [place], [place] [phone]   [facility] [address] [place], [place], [place] [phone]   [facility] [address],  [place], [place] [phone]   [facility] - Intensive Outpatient [address] Laurell Josephs [address], [place], [place] [phone]   [facility] ([facility] [address] [place]  [place], [place] [phone] or [phone]   [facility] ([facility]) [address], [place], [place] [phone] Accepts Medicaid  [facility] [address],  [place] [phone] Substance Abuse/Addiction Treatment   [facility] Organization         Address  Phone  Notes  [facility]  [phone]   Angie Fava, PhD [address] Ervin Knack [place], [place]   [phone] or [phone]   [facility]   [address] [place], [place] [phone]   [facility] [address], [place], [place] [phone] Insurance/Medicaid/sponsorship through [facility] Families [address]                                    [place], [place] [phone] Therapy/tele-psych/case  [facility] [address], [place] [phone]    Dr. Lolly Mustache  [phone]   [facility]  [employer] [facility] Dept. 1) [address] [place] 2) [address], [place] 3)  [address], [place] [phone]  [phone]   [facility] Child Abuse Hotline [phone] or 9738540020 (After Hours)

## 2015-01-25 NOTE — ED Provider Notes (Signed)
CSN: [id number]     Arrival date & time 01/14/15  1935 History   First MD Initiated Contact with Patient 01/15/15 0017     Chief Complaint  Patient presents with  . Abdominal Pain     (Consider location/radiation/quality/duration/timing/severity/associated sxs/prior Treatment) HPI   59yF with epigastric pain. Intermittent for over a week. Worse in past two days. Deep ache. Does not radiate. Burping frequently and pain improved with belching. No appreciable exacerbating or relieving factors otherwise. No n/v. No urinary complaints. No CP or SOB. No radiation.   Past Medical History  Diagnosis Date  . Medical history non-contributory    Past Surgical History  Procedure Laterality Date  . No past surgeries     Family History  Problem Relation Age of Onset  . Hypertension Mother   . Diabetes Mother    History  Substance Use Topics  . Smoking status: Never Smoker   . Smokeless tobacco: Not on file  . Alcohol Use: No   OB History    Gravida Para Term Preterm AB TAB SAB Ectopic Multiple Living   6 5 5  1 1    5      Review of Systems  All systems reviewed and negative, other than as noted in HPI.   Allergies  Sulfa antibiotics  Home Medications   Prior to Admission medications   Medication Sig Start Date End Date Taking? Authorizing Provider  aspirin 81 MG tablet Take 81 mg by mouth daily as needed for pain (pain).   Yes Historical Provider, MD  calcium-vitamin D 250-100 MG-UNIT per tablet Take 2 tablets by mouth daily.    Yes Historical Provider, MD  Cimetidine (HEARTBURN RELIEF PO) Take 1 tablet by mouth daily as needed (heartburn).   Yes Historical Provider, MD  hydrochlorothiazide (HYDRODIURIL) 25 MG tablet Take 1/2 tab (12.5mg ) qd 01/08/15  Yes Deirdre C Poe, CNM  ibuprofen (ADVIL,MOTRIN) 200 MG tablet Take 400 mg by mouth every 6 (six) hours as needed for moderate pain (pain).   Yes Historical Provider, MD  metroNIDAZOLE (FLAGYL) 500 MG tablet Take 1 tablet (500 mg  total) by mouth 2 (two) times daily. Take all 4 tabs at once Patient taking differently: Take 500 mg by mouth 2 (two) times daily.  01/08/15  Yes Deirdre C Poe, CNM  Multiple Vitamin (MULTIVITAMIN WITH MINERALS) TABS tablet Take 1 tablet by mouth daily.   Yes Historical Provider, MD  Omega-3 Fatty Acids (FISH OIL PO) Take 1 capsule by mouth daily.   Yes Historical Provider, MD  famotidine (PEPCID) 20 MG tablet Take 1 tablet (20 mg total) by mouth 2 (two) times daily. 01/15/15   Raeford RazorStephen Noelly Lasseigne, MD   BP 151/62 mmHg  Pulse 85  Temp(Src) 98 F (36.7 C) (Oral)  Resp 16  SpO2 95% Physical Exam  Constitutional: She appears well-developed and well-nourished. No distress.  HENT:  Head: Normocephalic and atraumatic.  Eyes: Conjunctivae are normal. Right eye exhibits no discharge. Left eye exhibits no discharge.  Neck: Neck supple.  Cardiovascular: Normal rate, regular rhythm and normal heart sounds.  Exam reveals no gallop and no friction rub.   No murmur heard. Pulmonary/Chest: Effort normal and breath sounds normal. No respiratory distress.  Abdominal: Soft. She exhibits no distension. There is tenderness.  Very mild epigastric tenderness w/o rebound or guarding  Musculoskeletal: She exhibits no edema or tenderness.  Neurological: She is alert.  Skin: Skin is warm and dry.  Psychiatric: She has a normal mood and affect. Her behavior  is normal. Thought content normal.  Nursing note and vitals reviewed.   ED Course  Procedures (including critical care time) Labs Review Labs Reviewed  BASIC METABOLIC PANEL - Abnormal; Notable for the following:    GFR calc non Af Amer 70 (*)    GFR calc Af Amer 81 (*)    All other components within normal limits  CBC WITH DIFFERENTIAL/PLATELET - Abnormal; Notable for the following:    Hemoglobin 11.9 (*)    All other components within normal limits  URINALYSIS, ROUTINE W REFLEX MICROSCOPIC - Abnormal; Notable for the following:    APPearance CLOUDY (*)     Hgb urine dipstick TRACE (*)    Leukocytes, UA MODERATE (*)    All other components within normal limits  URINE MICROSCOPIC-ADD ON - Abnormal; Notable for the following:    Squamous Epithelial / LPF MANY (*)    All other components within normal limits  AMYLASE  LIPASE, BLOOD    Imaging Review No results found.   EKG Interpretation   Date/Time:  Monday January 14 2015 19:53:48 EST Ventricular Rate:  82 PR Interval:  180 QRS Duration: 97 QT Interval:  391 QTC Calculation: 457 R Axis:   23 Text Interpretation:  Sinus rhythm Probable left atrial enlargement  Borderline T abnormalities, diffuse leads No old tracing to compare  Confirmed by Juleen China  MD, Jamaia Brum ([id number]) on 01/15/2015 1:14:18 AM      MDM   Final diagnoses:  Epigastric pain       Raeford Razor, MD 01/25/15 1520

## 2015-02-11 ENCOUNTER — Ambulatory Visit (INDEPENDENT_AMBULATORY_CARE_PROVIDER_SITE_OTHER): Payer: Self-pay | Admitting: Nurse Practitioner

## 2015-02-11 ENCOUNTER — Encounter: Payer: Self-pay | Admitting: Nurse Practitioner

## 2015-02-11 VITALS — BP 145/70 | HR 87 | Ht 63.0 in | Wt 220.0 lb

## 2015-02-11 DIAGNOSIS — N898 Other specified noninflammatory disorders of vagina: Secondary | ICD-10-CM

## 2015-02-11 DIAGNOSIS — I10 Essential (primary) hypertension: Secondary | ICD-10-CM

## 2015-02-11 DIAGNOSIS — N950 Postmenopausal bleeding: Secondary | ICD-10-CM

## 2015-02-11 LAB — POCT PREGNANCY, URINE: PREG TEST UR: NEGATIVE

## 2015-02-11 NOTE — Patient Instructions (Signed)
Postmenopausal Bleeding  Postmenopausal bleeding is any bleeding a woman has after she has entered into menopause. Menopause is the end of a woman's fertile years. After menopause, a woman no longer ovulates or has menstrual periods.   Postmenopausal bleeding can be caused by various things. Any type of postmenopausal bleeding, even if it appears to be a typical menstrual period, is concerning. This should be evaluated by your health care provider. Any treatment will depend on the cause of the bleeding.  HOME CARE INSTRUCTIONS  Monitor your condition for any changes. The following actions may help to alleviate any discomfort you are experiencing:  · Avoid the use of tampons and douches as directed by your health care provider.   · Change your pads frequently.  · Get regular pelvic exams and Pap tests.  · Keep all follow-up appointments for diagnostic tests as directed by your health care provider.  SEEK MEDICAL CARE IF:   · Your bleeding lasts more than 1 week.  · You have abdominal pain.  · You have bleeding with sexual intercourse.  SEEK IMMEDIATE MEDICAL CARE IF:   · You have a fever, chills, headache, dizziness, muscle aches, and bleeding.  · You have severe pain with bleeding.  · You are passing blood clots.  · You have bleeding and need more than 1 pad an hour.  · You feel faint.  MAKE SURE YOU:  · Understand these instructions.  · Will watch your condition.  · Will get help right away if you are not doing well or get worse.  Document Released: 03/03/2006 Document Revised: 09/13/2013 Document Reviewed: 06/22/2013  [employer]® Patient Information ©2015 [employer]. This information is not intended to replace advice given to you by your health care provider. Make sure you discuss any questions you have with your health care provider.

## 2015-02-11 NOTE — Progress Notes (Signed)
Pt took 3 pregnancy test at home in January and they are positive. Had negative test in [facility]. Had some spotting in January, none since that time.

## 2015-02-11 NOTE — Progress Notes (Signed)
History:   History:  Robin [name] is a 59 y.o. [id number] who [facility]. She had one episode one time about one month ago. She noticed pink on her panties. It was not related to intercourse. She is sexually active with the same partner. Her last pap was 6 years ago and she has never had an abnormal pap. She had her menopause 6 years ago and is on no HRT.   The following portions of the patient's history were reviewed and updated as appropriate: allergies, current medications, past family history, past medical history, past social history, past surgical history and problem list.  Review of Systems:  Pertinent items are noted in HPI.  Objective:  Physical Exam BP 145/70 mmHg  Pulse 87  Ht 5\' 3"  (1.6 m)  Wt 220 lb (99.791 kg)  BMI 38.98 kg/m2 GENERAL: Well-developed, well-nourished female in no acute distress.  HEENT: Normocephalic, atraumatic.  NECK: Supple. Normal thyroid.  LUNGS: Normal rate. Clear to auscultation bilaterally.  HEART: Regular rate and rhythm with no adventitious sounds.  ABDOMEN: Soft, nontender, nondistended. No organomegaly. Normal bowel sounds appreciated in all quadrants.  PELVIC: Normal external female genitalia. Vagina is pink and rugated.  Normal discharge. Normal cervix contour. Uterus is normal in size. No adnexal mass or tenderness.  EXTREMITIES: No cyanosis, clubbing, or edema, 2+ distal pulses.   Labs and Imaging No results found.  Assessment & Plan:  Assessment: Postmenopausal bleeding  Plans: She will go to [facility] pap clinic as she has no insurance Discussed case with Dr Erin FullingHarraway-Smith who advised pelvic ultrasound checking endometrial stripe She will return in 6 weeks with both procedures completed   Delbert PhenixLinda M Sol Odor, NP 02/11/2015 4:58 PM

## 2015-02-12 LAB — WET PREP, GENITAL
CLUE CELLS WET PREP: NONE SEEN
Trich, Wet Prep: NONE SEEN
Yeast Wet Prep HPF POC: NONE SEEN

## 2015-02-14 ENCOUNTER — Ambulatory Visit (HOSPITAL_COMMUNITY)
Admission: RE | Admit: 2015-02-14 | Discharge: 2015-02-14 | Disposition: A | Discharge status: Home / Self Care | Payer: Self-pay | Source: Ambulatory Visit | Attending: Nurse Practitioner | Admitting: Nurse Practitioner

## 2015-02-14 DIAGNOSIS — N950 Postmenopausal bleeding: Secondary | ICD-10-CM | POA: Insufficient documentation

## 2015-02-14 DIAGNOSIS — N898 Other specified noninflammatory disorders of vagina: Secondary | ICD-10-CM | POA: Insufficient documentation

## 2015-08-21 ENCOUNTER — Encounter: Payer: Self-pay | Admitting: Obstetrics & Gynecology

## 2016-02-15 IMAGING — US US PELVIS COMPLETE
1 series · 13 of 25 positions shown · non-contrast
Comparison: None

CLINICAL DATA: Patient with history of postmenopausal bleeding.



[Series 1: us pelvis complete · 13 of 40 slices shown]
[im 1/40]
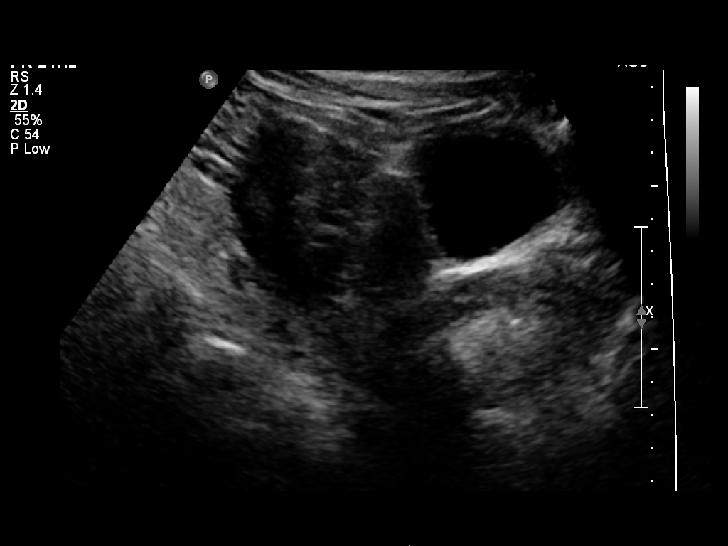
[im 4/40]
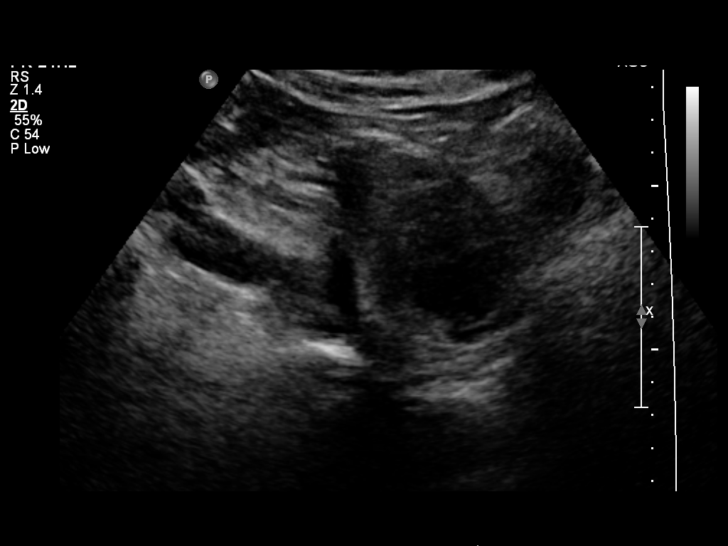
[im 7/40]
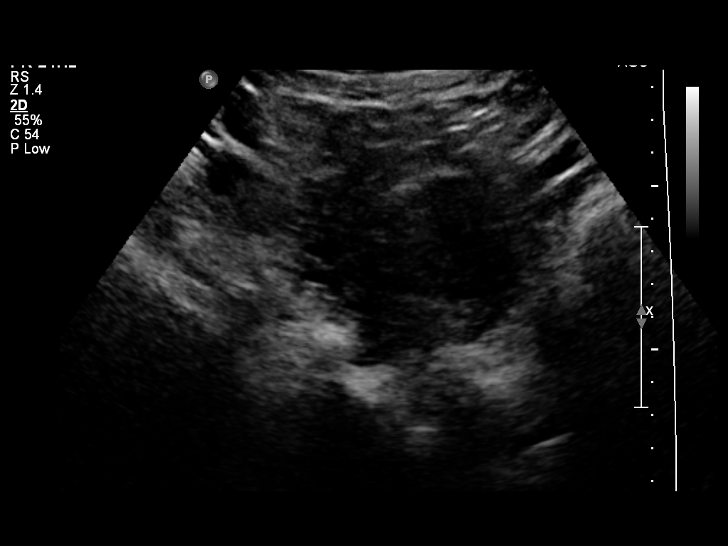
[im 10/40]
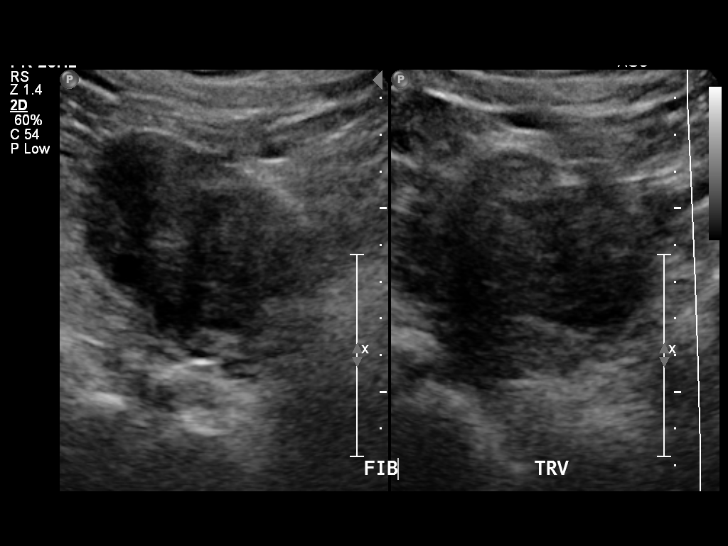
[im 14/40]
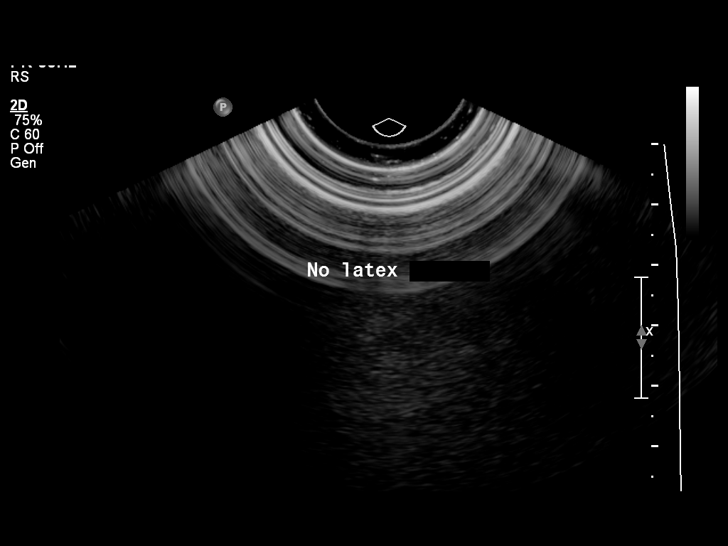
[im 17/40]
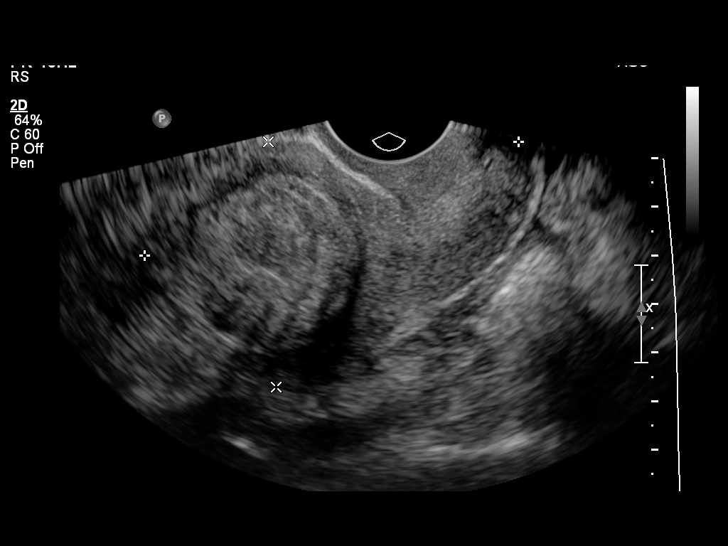
[im 20/40]
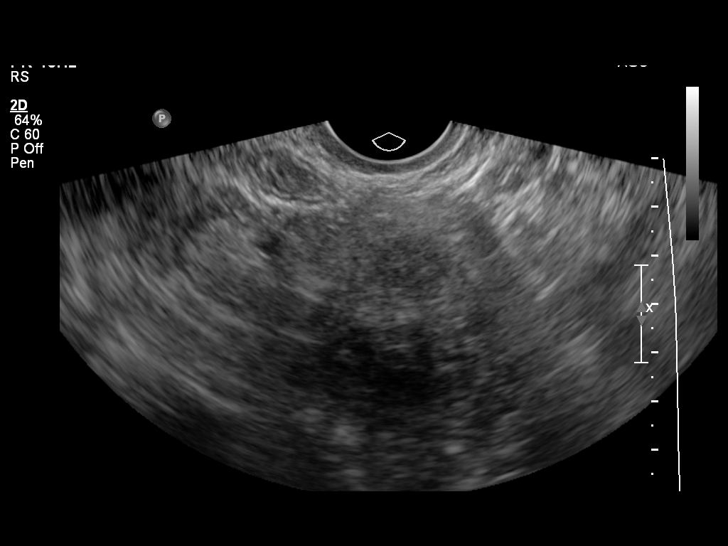
[im 23/40]
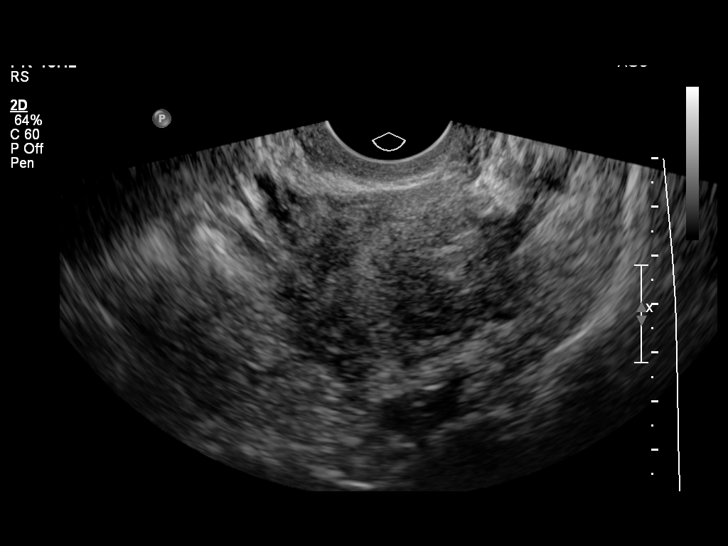
[im 27/40]
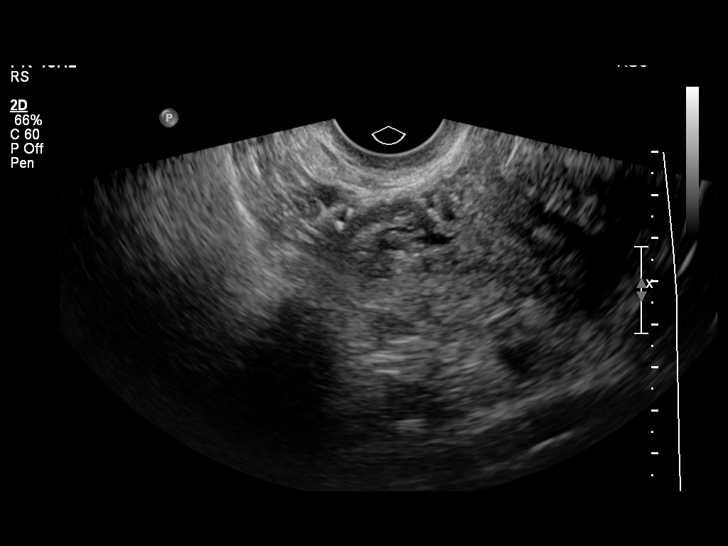
[im 30/40]
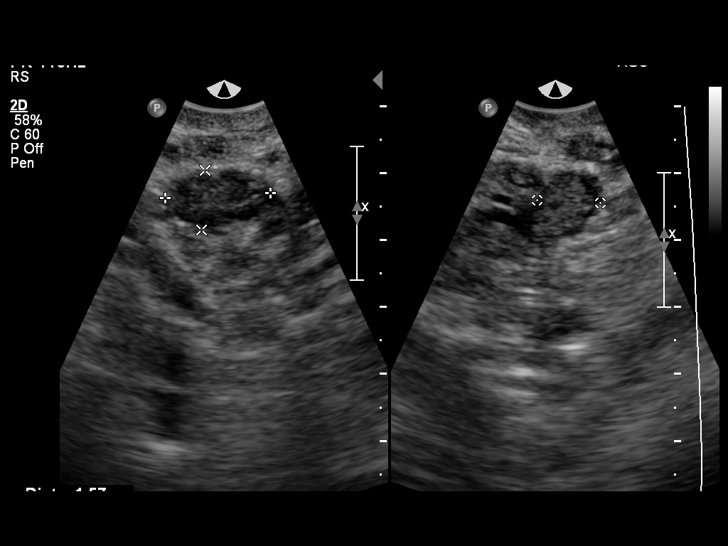
[im 33/40]
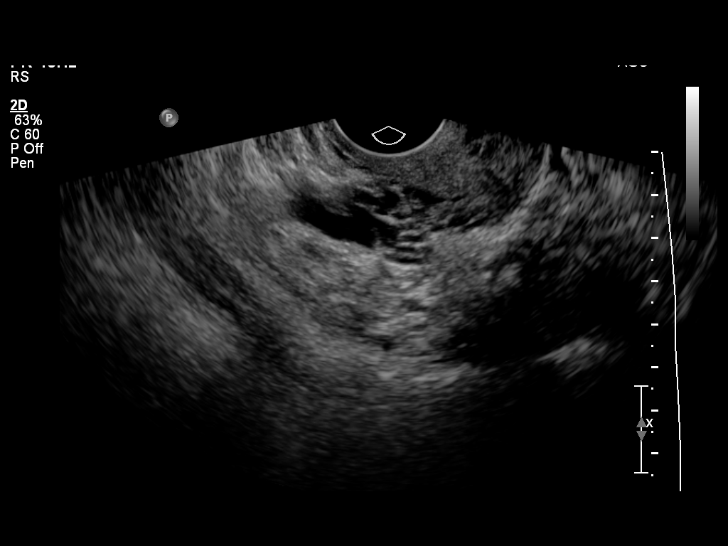
[im 36/40]
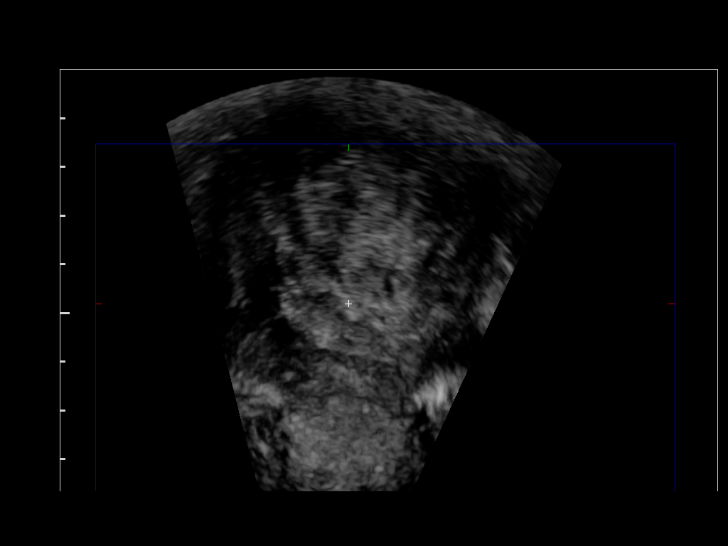
[im 40/40]
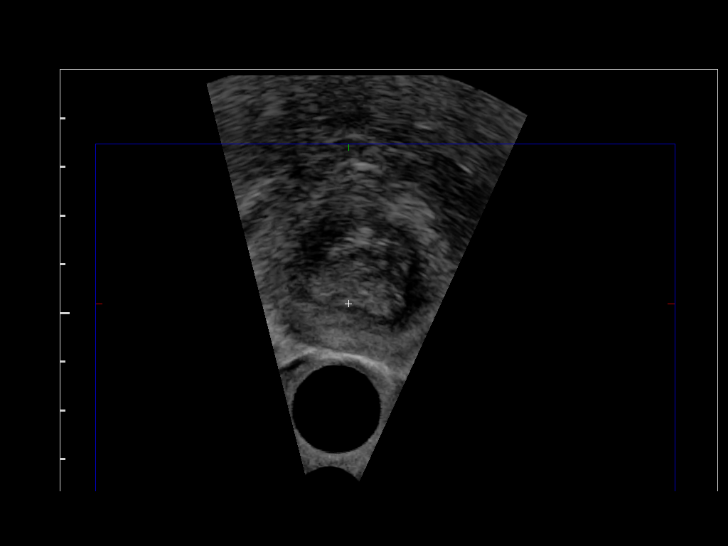

[13 of 25 positions shown; findings below may reference images not displayed]

FINDINGS: Uterus

Measurements: 8.0 x 5.1 x 6.4 cm. Within the central aspect of the
uterus there is a dominant fibroid which measures 4.9 x 3.9 x
cm. Anteverted.

Endometrium

Unable to be evaluated secondary to large fibroid.

Right ovary

Measurements: 1.6 x 0.9 x 0.9 cm. Normal appearance/no adnexal mass.

Left ovary

Not visualized.

Other findings

No free fluid.
IMPRESSION: Single large dominant fibroid within the central aspect of the
uterus.

The endometrium is not well visualized secondary to the large
fibroid. In the setting of post-menopausal bleeding, endometrial
sampling is indicated to exclude carcinoma. If results are benign,
sonohysterogram should be considered for focal lesion work-up. (Ref:
Radiological Reasoning: Algorithmic Workup of Abnormal Vaginal
Bleeding with Endovaginal Sonography and Sonohysterography. AJR
[id number]; 191:S68-73)

## 2022-08-13 ENCOUNTER — Other Ambulatory Visit: Payer: Self-pay | Admitting: *Deleted

## 2022-08-13 DIAGNOSIS — M79604 Pain in right leg: Secondary | ICD-10-CM

## 2022-08-17 NOTE — Progress Notes (Unsigned)
[facility]           OF [place]  History and Physical   Robin [name] is a 66 y.o. female who presents with varicose veins of the right leg.  She does not have any hx of DVT.  She does have family hx of varicose veins with her mother and sister.  She does not have any skin changes on her lower legs.  She has worn thigh high compression in the past but is not wearing them every day.  She denies any pain in her legs.  She does not really have any swelling in her legs.  She denies any achiness or itching.  She has a small area behind her ankle that bothers her.    The pt is not on a statin for cholesterol management.  The pt is on a daily aspirin.   Other AC:  none The pt is not on medication for hypertension.   The pt is not diabetic.   Tobacco hx:  never  Pt does not have family hx of AAA.  Past Medical History:  Diagnosis Date   Acid reflux    Hypertension    Medical history non-contributory     Past Surgical History:  Procedure Laterality Date   NO PAST SURGERIES      Social History   Socioeconomic History   Marital status: Single    Spouse name: Not on file   Number of children: Not on file   Years of education: Not on file   Highest education level: Not on file  Occupational History   Not on file  Tobacco Use   Smoking status: Never   Smokeless tobacco: Never  Substance and Sexual Activity   Alcohol use: No   Drug use: No   Sexual activity: Yes  Other Topics Concern   Not on file  Social History Narrative   Not on file   Social Determinants of Health   Financial Resource Strain: Not on file  Food Insecurity: Not on file  Transportation Needs: Not on file  Physical Activity: Not on file  Stress: Not on file  Social Connections: Not on file  Intimate Partner Violence: Not on file    Family History  Problem Relation Age of Onset   Hypertension Mother    Diabetes Mother     Current Outpatient Medications   Medication Sig Dispense Refill   aspirin 81 MG tablet Take 81 mg by mouth daily as needed for pain (pain).     calcium-vitamin D 250-100 MG-UNIT per tablet Take 2 tablets by mouth daily.      Cimetidine (HEARTBURN RELIEF PO) Take 1 tablet by mouth daily as needed (heartburn).     famotidine (PEPCID) 20 MG tablet Take 1 tablet (20 mg total) by mouth 2 (two) times daily. 60 tablet 0   hydrochlorothiazide (HYDRODIURIL) 25 MG tablet Take 1/2 tab (12.5mg ) qd 30 tablet 3   ibuprofen (ADVIL,MOTRIN) 200 MG tablet Take 400 mg by mouth every 6 (six) hours as needed for moderate pain (pain).     metroNIDAZOLE (FLAGYL) 500 MG tablet Take 1 tablet (500 mg total) by mouth 2 (two) times daily. Take all 4 tabs at once (Patient not taking: Reported on 02/11/2015) 14 tablet 0   Multiple Vitamin (MULTIVITAMIN WITH MINERALS) TABS tablet Take 1 tablet by mouth daily.     Omega-3 Fatty Acids (FISH OIL PO) Take 1 capsule by mouth daily.  No current facility-administered medications for this visit.    Allergies  Allergen Reactions   Sulfa Antibiotics Other (See Comments)    Head and throat hurt    REVIEW OF SYSTEMS:   [X]  denotes positive finding, [ ]  denotes negative finding Cardiac  Comments:  Chest pain or chest pressure:    Shortness of breath upon exertion:    Short of breath when lying flat:    Irregular heart rhythm:        Vascular    Pain in calf, thigh, or hip brought on by ambulation:    Pain in feet at night that wakes you up from your sleep:     Blood clot in your veins:    Leg swelling:         Pulmonary    Oxygen at home:    Productive cough:     Wheezing:         Neurologic    Sudden weakness in arms or legs:     Sudden numbness in arms or legs:     Sudden onset of difficulty speaking or slurred speech:    Temporary loss of vision in one eye:     Problems with dizziness:         Gastrointestinal    Blood in stool:     Vomited blood:         Genitourinary    Burning when  urinating:     Blood in urine:        Psychiatric    Major depression:         Hematologic    Bleeding problems:    Problems with blood clotting too easily:        Skin    Rashes or ulcers:        Constitutional    Fever or chills:      PHYSICAL EXAMINATION:  Today's Vitals   08/19/22 1132  BP: (!) 150/79  Pulse: 61  Temp: 97.8 F (36.6 C)  TempSrc: Temporal  SpO2: 99%  Weight: 212 lb (96.2 kg)  Height: 5\' 3"  (1.6 m)   Body mass index is 37.55 kg/m.   General:  WDWN in NAD; vital signs documented above Gait: Not observed HENT: WNL, normocephalic Pulmonary: normal non-labored breathing without wheezing Cardiac: regular HR; without carotid bruits Abdomen: soft, NT, aortic pulse is not palpable Skin: without rashes Vascular Exam/Pulses:  Right Left  Radial 2+ (normal) 2+ (normal)  DP 1+ (weak) 2+ (normal)  PT 1+ (weak) 2+ (normal)   Extremities: + large varicosity right medial lower leg.  Area on the posterior ankle area with enlarged vein. Small area of cluster of spider veins on the lateral aspect of the left leg at the knee Neurologic: A&O X 3;  moving all extremities equally Psychiatric:  The pt has  flat  affect.   Non-Invasive Vascular Imaging:   Venous duplex on 08/19/2022: Venous Reflux Times  +--------------+---------+------+-----------+------------+----------------+  RIGHT         Reflux NoRefluxReflux TimeDiameter cmsComments                                  Yes                                           +--------------+---------+------+-----------+------------+----------------+  CFV  yes   >1 second                               +--------------+---------+------+-----------+------------+----------------+  FV mid                  yes   >1 second                               +--------------+---------+------+-----------+------------+----------------+  Popliteal               yes   >1 second                                +--------------+---------+------+-----------+------------+----------------+  GSV at [facility]              yes    >500 ms      1.34                      +--------------+---------+------+-----------+------------+----------------+  GSV prox thigh          yes    >500 ms      0.60                      +--------------+---------+------+-----------+------------+----------------+  GSV mid thigh           yes    >500 ms      0.67                      +--------------+---------+------+-----------+------------+----------------+  GSV dist thigh          yes    >500 ms      0.65                      +--------------+---------+------+-----------+------------+----------------+  GSV at knee             yes    >500 ms      0.62                      +--------------+---------+------+-----------+------------+----------------+  GSV prox calf           yes    >500 ms      0.91                      +--------------+---------+------+-----------+------------+----------------+  GSV mid calf            yes    >500 ms      0.58                      +--------------+---------+------+-----------+------------+----------------+  SSV Pop Fossa           yes    >500 ms      0.50    chronic thrombus  +--------------+---------+------+-----------+------------+----------------+   Summary:  Right:  - No evidence of deep vein thrombosis from the common femoral through the popliteal veins.  - Evidence of chronic superficial venous thrombosis involving the small saphenous vein.  - The deep venous system is not competent.  - The great saphenous vein is not competent.  - The small saphenous vein is not competent.    Robin [name] is a 66 y.o. female who presents with: varicose veins of right leg    -  pt has palpable pedal pulses bilaterally but really no symptoms of swelling, achiness.  She does have an enlarged vein on the posterior ankle but do not feel a laser  ablation would help this area.   -pt does not have evidence of DVT.  Pt does have venous reflux in the deep venous system as well as in the GSV at the [facility] throughout the GSV down to the SSV.  -discussed with pt about wearing thigh high 20-30 mmHg compression stockings daily -discussed the importance of leg elevation and how to elevate properly - pt is advised to elevate their legs and a diagram is given to them to demonstrate for pt to lay flat on their back with knees elevated and slightly bent with their feet higher than their knees, which puts their feet higher than their heart for 15 minutes per day.  If pt cannot lay flat, advised to lay as flat as possible.  -pt is advised to continue as much walking as possible and avoid sitting or standing for long periods of time.  -discussed importance of weight loss and exercise and that water aerobics would also be beneficial.  -handout with recommendations given -pt currently not interested in procedure but will call if she decides to be considered for laser ablation.  -discussed with pt that if any of her spider veins bleed in the future, how to hold pressure to stop bleeding.  She expressed understanding.   Doreatha Massed, [facility] [phone]  Clinic MD:  Randie Heinz

## 2022-08-19 ENCOUNTER — Ambulatory Visit (HOSPITAL_COMMUNITY)
Admission: RE | Admit: 2022-08-19 | Discharge: 2022-08-19 | Disposition: A | Discharge status: Home / Self Care | Payer: Medicare HMO | Source: Ambulatory Visit | Attending: Vascular Surgery | Admitting: Vascular Surgery

## 2022-08-19 ENCOUNTER — Ambulatory Visit (INDEPENDENT_AMBULATORY_CARE_PROVIDER_SITE_OTHER): Payer: Self-pay | Admitting: Physician Assistant

## 2022-08-19 VITALS — BP 150/79 | HR 61 | Temp 97.8°F | Ht 63.0 in | Wt 212.0 lb

## 2022-08-19 DIAGNOSIS — M79604 Pain in right leg: Secondary | ICD-10-CM | POA: Diagnosis present

## 2022-08-19 DIAGNOSIS — I8391 Asymptomatic varicose veins of right lower extremity: Secondary | ICD-10-CM
